# Patient Record
Sex: Male | Born: 2000 | Race: Black or African American | Hispanic: No | Marital: Single | Smoking: Never smoker
Health system: Southern US, Community
[De-identification: ages and names within clinical notes are randomized; demographics above are authoritative.]

---

## 2018-11-07 ENCOUNTER — Emergency Department (HOSPITAL_COMMUNITY): Payer: Managed Care, Other (non HMO)

## 2018-11-07 ENCOUNTER — Other Ambulatory Visit: Payer: Self-pay

## 2018-11-07 ENCOUNTER — Emergency Department (HOSPITAL_COMMUNITY)
Admission: EM | Admit: 2018-11-07 | Discharge: 2018-11-07 | Disposition: A | Discharge status: Home / Self Care | Payer: Managed Care, Other (non HMO) | Attending: Emergency Medicine | Admitting: Emergency Medicine

## 2018-11-07 ENCOUNTER — Encounter (HOSPITAL_COMMUNITY): Payer: Self-pay

## 2018-11-07 DIAGNOSIS — Y999 Unspecified external cause status: Secondary | ICD-10-CM | POA: Insufficient documentation

## 2018-11-07 DIAGNOSIS — Y929 Unspecified place or not applicable: Secondary | ICD-10-CM | POA: Insufficient documentation

## 2018-11-07 DIAGNOSIS — Y939 Activity, unspecified: Secondary | ICD-10-CM | POA: Insufficient documentation

## 2018-11-07 DIAGNOSIS — S43015A Anterior dislocation of left humerus, initial encounter: Secondary | ICD-10-CM | POA: Diagnosis not present

## 2018-11-07 DIAGNOSIS — S43005A Unspecified dislocation of left shoulder joint, initial encounter: Secondary | ICD-10-CM

## 2018-11-07 DIAGNOSIS — S4992XA Unspecified injury of left shoulder and upper arm, initial encounter: Secondary | ICD-10-CM | POA: Diagnosis present

## 2018-11-07 MED ORDER — IBUPROFEN 800 MG PO TABS: 800.0000 mg | ORAL_TABLET | Freq: Three times a day (TID) | ORAL | 0 refills | Status: AC

## 2018-11-07 MED ORDER — PROPOFOL 10 MG/ML IV BOLUS
INTRAVENOUS | Status: AC
  Filled 2018-11-07: qty 20

## 2018-11-07 MED ORDER — PROPOFOL 10 MG/ML IV BOLUS
INTRAVENOUS | Status: AC | PRN
  Administered 2018-11-07: 30 mg via INTRAVENOUS
  Administered 2018-11-07 (×2): 20 mg via INTRAVENOUS
  Administered 2018-11-07: 50 mg via INTRAVENOUS

## 2018-11-07 NOTE — Discharge Instructions (Addendum)
1.  Wear your sling at all times.  You may remove it to shower.  You may move your hand and lower arm but she may not raise your arm forward or at your side of the shoulder. 2.  Take ibuprofen if needed for pain. 3.  Since this is a repeat dislocation and also your x-ray shows a possible small fracture, it is very important that you follow-up with the orthopedic doctor.  Call Dr. Carlean Jews office to schedule a recheck this week.

## 2018-11-07 NOTE — ED Notes (Signed)
E-signature not available. Pt and SO verbalized understanding of DC instructions and Rx.  Pt SO to drive home.

## 2018-11-07 NOTE — Sedation Documentation (Signed)
Pt.'s arm placed on sling , post reduction x ray done
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
## 2020-09-12 DEATH — deceased

## 2020-10-30 IMAGING — DX DG SHOULDER 1V*L*
2 series · 2 of 2 positions shown · non-contrast
Comparison: Same day.

CLINICAL DATA: Left shoulder pain after reduction.

EXAM:
LEFT SHOULDER - 1 VIEW

[shoulder ap]
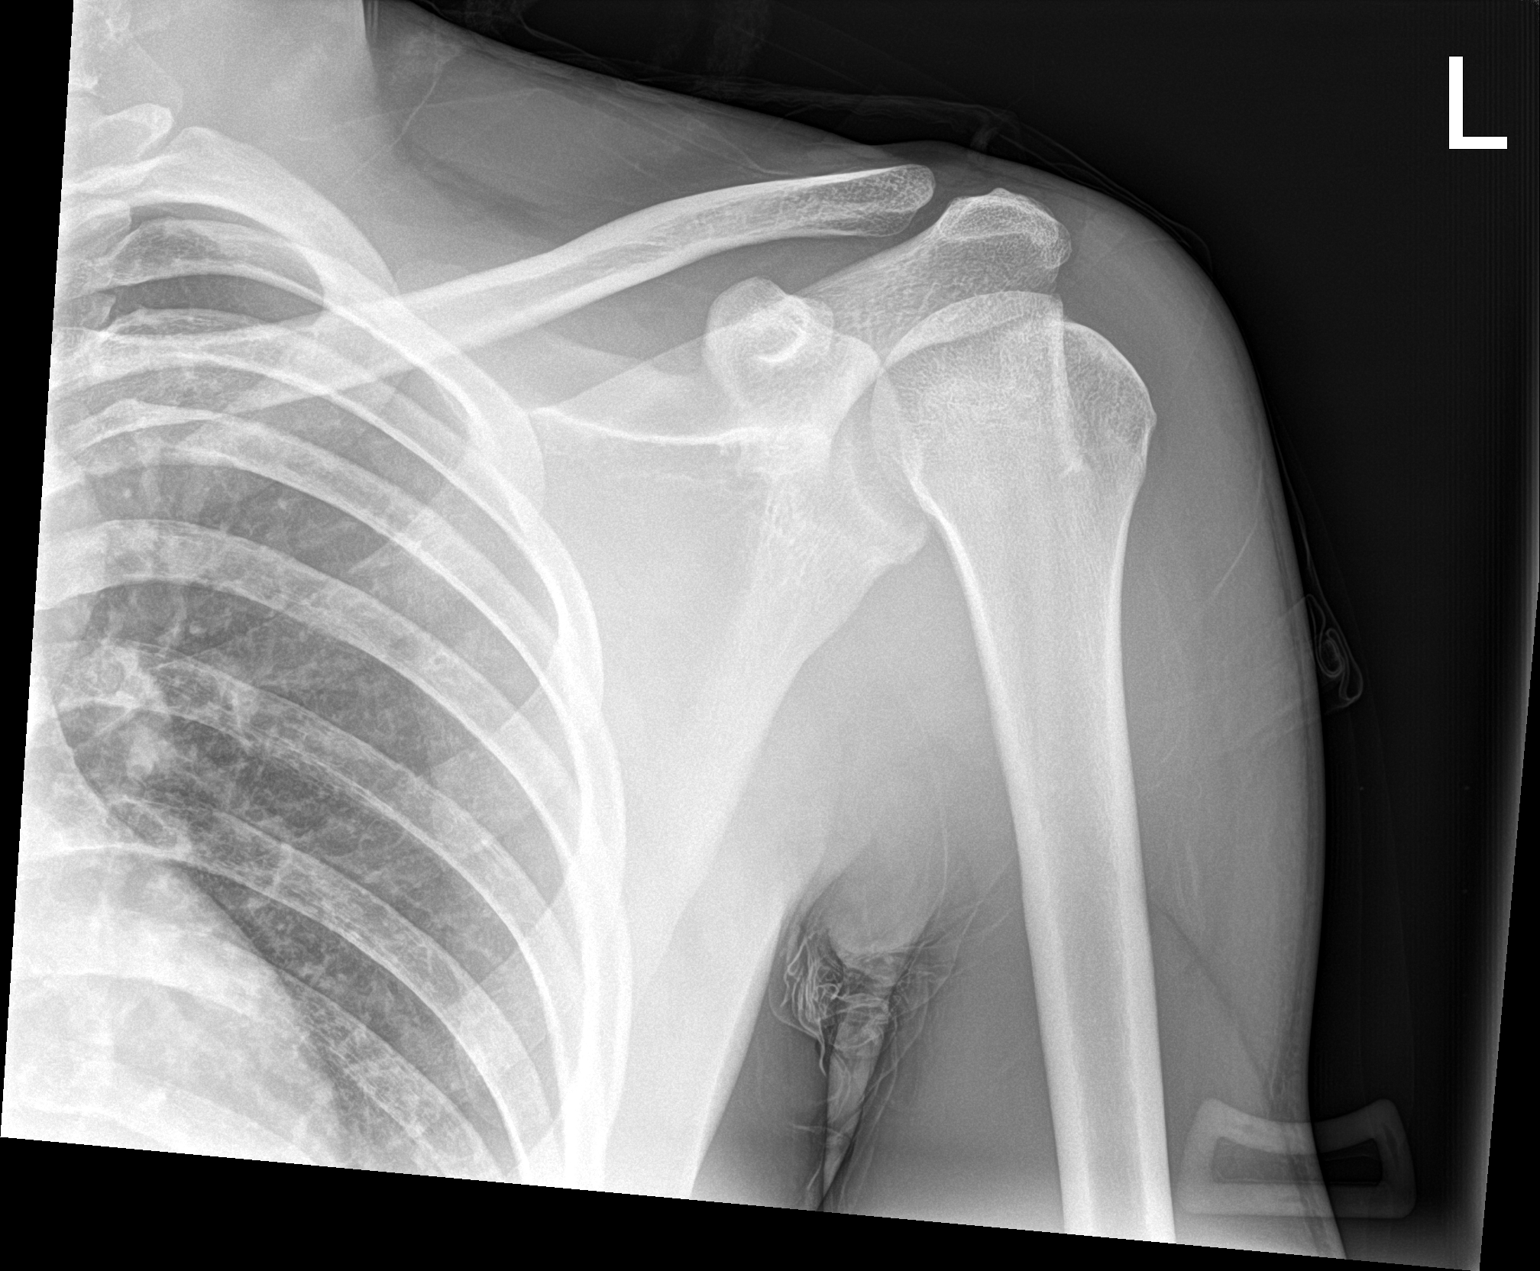

[shoulder obl]
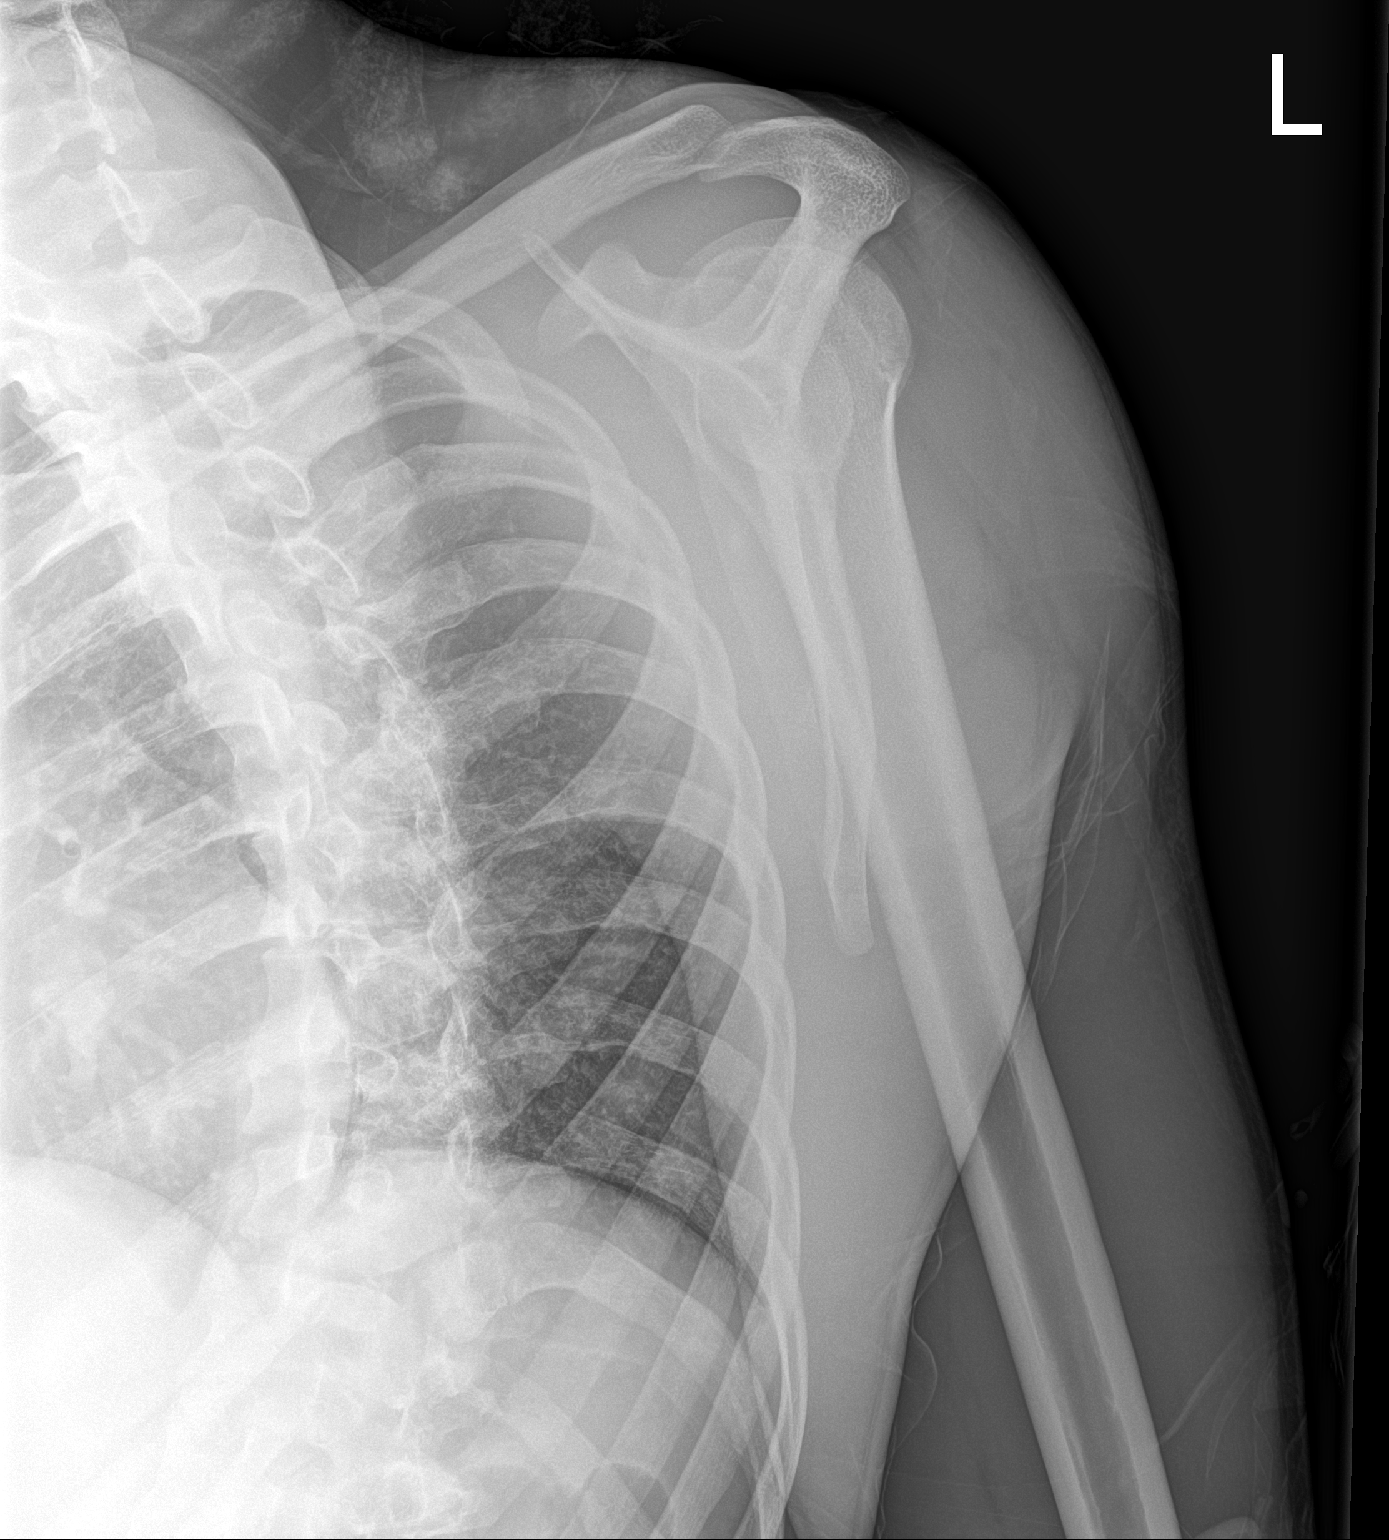

[2 of 2 positions shown; findings below may reference images not displayed]

FINDINGS: Anterior dislocation noted on prior exam has been successfully
reduced. There is a step-off of the left humeral head in the
possibly fracture cannot be excluded. Acromioclavicular and
glenohumeral joints are unremarkable.
IMPRESSION: Anterior dislocation noted on prior exam has been successfully
reduced. However, there is a cortical step-off involving the lateral
portion of the left humeral head seen on 1 projection, and possible
fracture cannot be excluded. CT scan may be performed for further
evaluation.

## 2020-10-30 IMAGING — CR DG SHOULDER 2+V*L*
2 series · 2 of 2 positions shown · non-contrast
Comparison: None.

CLINICAL DATA: Pain

EXAM:
LEFT SHOULDER - 2+ VIEW

[shoulder grashey]
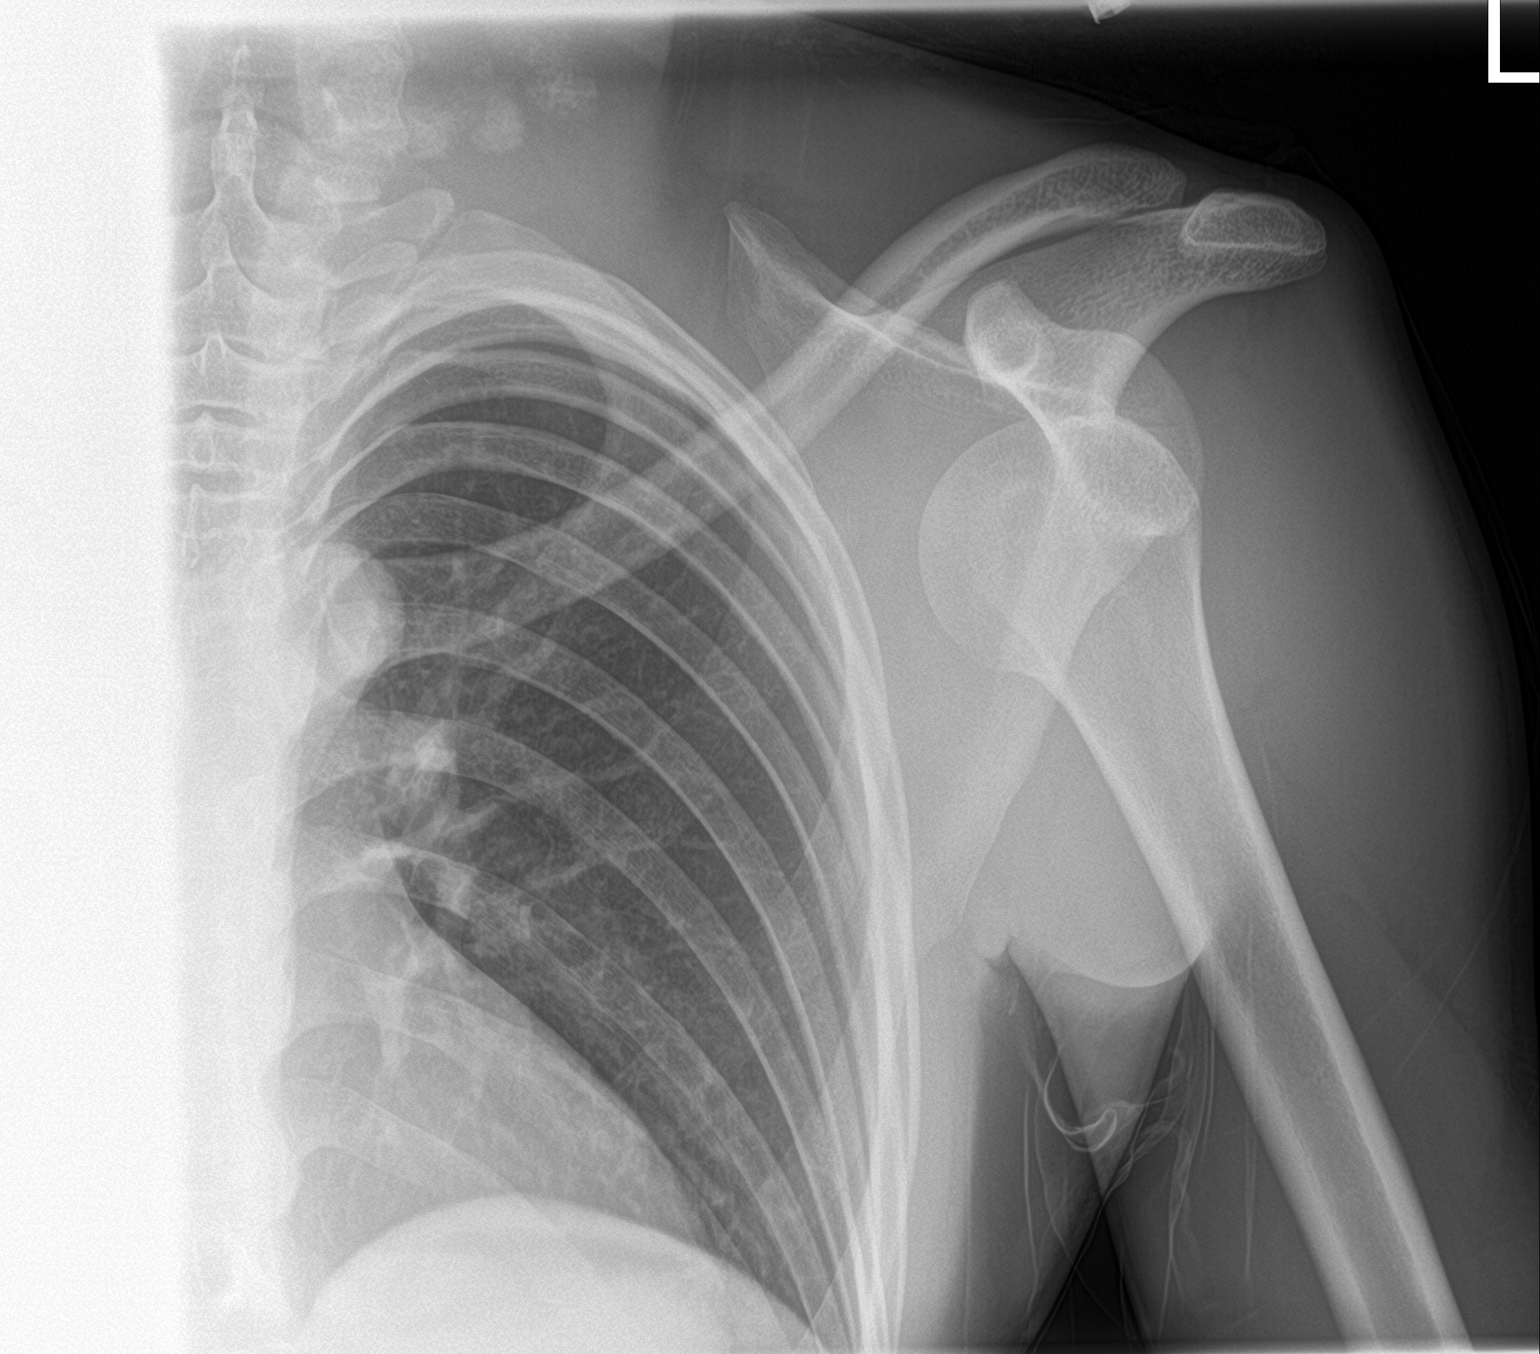

[shoulder y view]
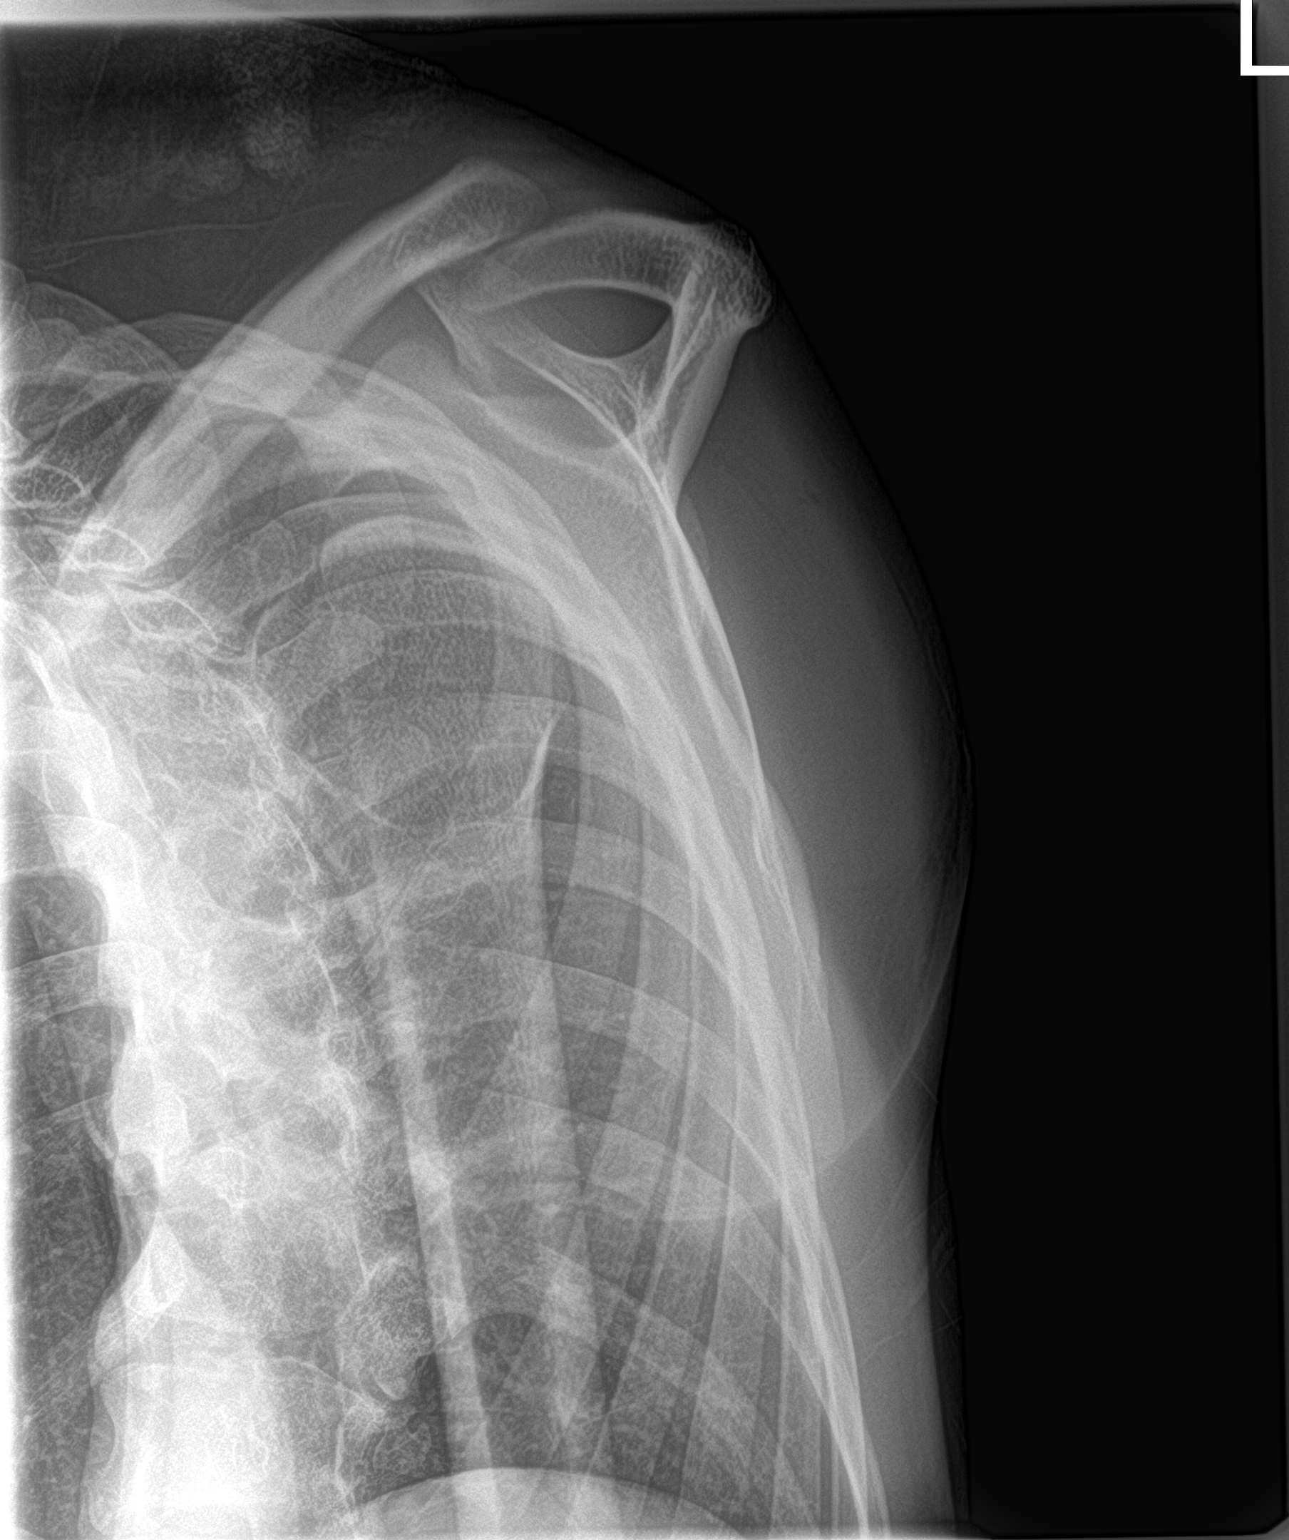

[2 of 2 positions shown; findings below may reference images not displayed]

FINDINGS: Frontal and Y scapular images obtained. There is a subcoracoid
anterior dislocation. No fracture evident. Joint spaces appear
normal. Visualized left lung clear.
IMPRESSION: Subcoracoid anterior dislocation. No evident fracture. No
arthropathy.
